# Patient Record
Sex: Male | Born: 1985 | Hispanic: Yes | Marital: Married | Smoking: Never smoker
Health system: Southern US, Community
[De-identification: ages and names within clinical notes are randomized; demographics above are authoritative.]

## PROBLEM LIST (undated history)

## (undated) DIAGNOSIS — I2699 Other pulmonary embolism without acute cor pulmonale: Secondary | ICD-10-CM

---

## 2020-01-15 ENCOUNTER — Emergency Department (HOSPITAL_COMMUNITY)
Admission: EM | Admit: 2020-01-15 | Discharge: 2020-01-15 | Disposition: A | Discharge status: Home / Self Care | Payer: Self-pay | Attending: Emergency Medicine | Admitting: Emergency Medicine

## 2020-01-15 ENCOUNTER — Other Ambulatory Visit: Payer: Self-pay

## 2020-01-15 ENCOUNTER — Emergency Department (HOSPITAL_COMMUNITY): Payer: Self-pay

## 2020-01-15 ENCOUNTER — Encounter (HOSPITAL_COMMUNITY): Payer: Self-pay | Admitting: Emergency Medicine

## 2020-01-15 DIAGNOSIS — M25512 Pain in left shoulder: Secondary | ICD-10-CM | POA: Insufficient documentation

## 2020-01-15 DIAGNOSIS — Z86711 Personal history of pulmonary embolism: Secondary | ICD-10-CM | POA: Insufficient documentation

## 2020-01-15 DIAGNOSIS — R0602 Shortness of breath: Secondary | ICD-10-CM | POA: Insufficient documentation

## 2020-01-15 DIAGNOSIS — R072 Precordial pain: Secondary | ICD-10-CM | POA: Insufficient documentation

## 2020-01-15 HISTORY — DX: Other pulmonary embolism without acute cor pulmonale: I26.99

## 2020-01-15 LAB — CBC
HCT: 44.1 % (ref 39.0–52.0)
Hemoglobin: 15 g/dL (ref 13.0–17.0)
MCH: 28.4 pg (ref 26.0–34.0)
MCHC: 34 g/dL (ref 30.0–36.0)
MCV: 83.5 fL (ref 80.0–100.0)
Platelets: 305 10*3/uL (ref 150–400)
RBC: 5.28 MIL/uL (ref 4.22–5.81)
RDW: 12.8 % (ref 11.5–15.5)
WBC: 6 10*3/uL (ref 4.0–10.5)
nRBC: 0 % (ref 0.0–0.2)

## 2020-01-15 LAB — BASIC METABOLIC PANEL
Anion gap: 10 (ref 5–15)
BUN: 15 mg/dL (ref 6–20)
CO2: 23 mmol/L (ref 22–32)
Calcium: 9.2 mg/dL (ref 8.9–10.3)
Chloride: 105 mmol/L (ref 98–111)
Creatinine, Ser: 0.75 mg/dL (ref 0.61–1.24)
GFR calc Af Amer: >60 mL/min (ref >60)
GFR calc non Af Amer: >60 mL/min (ref >60)
Glucose, Bld: 120 mg/dL — ABNORMAL HIGH (ref 70–99)
Potassium: 3.6 mmol/L (ref 3.5–5.1)
Sodium: 138 mmol/L (ref 135–145)

## 2020-01-15 LAB — TROPONIN I (HIGH SENSITIVITY)
Troponin I (High Sensitivity): <2 ng/L (ref <18)
Troponin I (High Sensitivity): <2 ng/L (ref <18)

## 2020-01-15 MED ORDER — IOHEXOL 350 MG/ML SOLN
100.0000 mL | Freq: Once | INTRAVENOUS | Status: AC | PRN
  Administered 2020-01-15: 100 mL via INTRAVENOUS

## 2020-01-15 MED ORDER — SODIUM CHLORIDE 0.9 % IV SOLN: INTRAVENOUS | Status: DC

## 2020-01-15 MED ORDER — SODIUM CHLORIDE 0.9% FLUSH: 3.0000 mL | Freq: Once | INTRAVENOUS | Status: DC

## 2020-01-15 NOTE — ED Provider Notes (Signed)
[facility] EMERGENCY DEPARTMENT Provider Note   CSN: [id number] Arrival date & time: 01/15/20  1145     History Chief Complaint  Patient presents with  . Chest Pain    Treveon Bourcier is a 34 y.o. male.  Patient with a complaint of left anterior left shoulder pain for the past 3 days.  Has been nonstop.  Not worse with moving of the arm.  Patient has a history of pulmonary embolus back in 2009.  Patient denies any leg swelling.  Denies any fevers any upper respiratory infection or congestion.  Denies any history of injury.        Past Medical History:  Diagnosis Date  . Pulmonary embolus (HCC)     There are no problems to display for this patient.   History reviewed. No pertinent surgical history.     No family history on file.  Social History   Tobacco Use  . Smoking status: Never Smoker  . Smokeless tobacco: Never Used  Substance Use Topics  . Alcohol use: Yes    Comment: weekend  . Drug use: Not Currently    Home Medications Prior to Admission medications   Medication Sig Start Date End Date Taking? Authorizing Provider  rivaroxaban (XARELTO) 20 MG TABS tablet Take 1 tablet by mouth daily.    [provider]    Allergies    Patient has no known allergies.  Review of Systems   Review of Systems  Constitutional: Negative for chills and fever.  HENT: Negative for congestion, rhinorrhea and sore throat.   Eyes: Negative for visual disturbance.  Respiratory: Positive for shortness of breath. Negative for cough.   Cardiovascular: Positive for chest pain. Negative for leg swelling.  Gastrointestinal: Negative for abdominal pain, diarrhea, nausea and vomiting.  Genitourinary: Negative for dysuria.  Musculoskeletal: Negative for back pain and neck pain.  Skin: Negative for rash.  Neurological: Negative for dizziness, light-headedness and headaches.  Hematological: Does not bruise/bleed easily.  Psychiatric/Behavioral: Negative for confusion.     Physical Exam Updated Vital Signs BP 117/75   Pulse 71   Temp 98 F (36.7 C) (Oral)   Resp 18   Wt 93.1 kg   SpO2 100%   Physical Exam Vitals and nursing note reviewed.  Constitutional:      Appearance: Normal appearance. He is well-developed.  HENT:     Head: Normocephalic and atraumatic.  Eyes:     Extraocular Movements: Extraocular movements intact.     Conjunctiva/sclera: Conjunctivae normal.     Pupils: Pupils are equal, round, and reactive to light.  Cardiovascular:     Rate and Rhythm: Normal rate and regular rhythm.     Heart sounds: No murmur.  Pulmonary:     Effort: Pulmonary effort is normal. No respiratory distress.     Breath sounds: Normal breath sounds.  Chest:     Chest wall: No tenderness.  Abdominal:     Palpations: Abdomen is soft.     Tenderness: There is no abdominal tenderness.  Musculoskeletal:     Cervical back: Normal range of motion and neck supple.  Skin:    General: Skin is warm and dry.  Neurological:     General: No focal deficit present.     Mental Status: He is alert and oriented to person, place, and time.     ED Results / Procedures / Treatments   Labs (all labs ordered are listed, but only abnormal results are displayed) Labs Reviewed  BASIC METABOLIC PANEL -  Abnormal; Notable for the following components:      Result Value   Glucose, Bld 120 (*)    All other components within normal limits  CBC  TROPONIN I (HIGH SENSITIVITY)  TROPONIN I (HIGH SENSITIVITY)    EKG EKG Interpretation  Date/Time:  Saturday January 15 2020 12:02:15 EST Ventricular Rate:  78 PR Interval:    QRS Duration: 100 QT Interval:  375 QTC Calculation: 428 R Axis:   82 Text Interpretation: Sinus rhythm Confirmed by Vanetta Mulders [phone] on 01/15/2020 12:16:01 PM   Radiology DG Chest 2 View  Result Date: 01/15/2020 CLINICAL DATA:  Left shoulder pain and shortness of breath for a few days. EXAM: CHEST - 2 VIEW COMPARISON:  None. FINDINGS:  Lungs clear. Heart size normal. No pneumothorax or pleural fluid. No bony abnormality. IMPRESSION: Negative chest. Electronically Signed   By: Drusilla Kanner M.D.   On: 01/15/2020 12:45   CT Angio Chest PE W/Cm &/Or Wo Cm  Result Date: 01/15/2020 CLINICAL DATA:  Left shoulder pain, shortness of breath for several days EXAM: CT ANGIOGRAPHY CHEST WITH CONTRAST TECHNIQUE: Multidetector CT imaging of the chest was performed using the standard protocol during bolus administration of intravenous contrast. Multiplanar CT image reconstructions and MIPs were obtained to evaluate the vascular anatomy. CONTRAST:  OMNIPAQUE IOHEXOL 350 MG/ML SOLN COMPARISON:  Same-day chest radiographs FINDINGS: Cardiovascular: Satisfactory opacification of the pulmonary arteries to the segmental level. No evidence of pulmonary embolism. Normal heart size. No pericardial effusion. Mediastinum/Nodes: No enlarged mediastinal, hilar, or axillary lymph nodes. Thyroid gland, trachea, and esophagus demonstrate no significant findings. Lungs/Pleura: Lungs are clear. No pleural effusion or pneumothorax. Upper Abdomen: No acute abnormality. Musculoskeletal: No chest wall abnormality. No acute or significant osseous findings. Review of the MIP images confirms the above findings. IMPRESSION: Negative examination for pulmonary embolism. Electronically Signed   By: Lauralyn Primes M.D.   On: 01/15/2020 14:20    Procedures Procedures (including critical care time)  Medications Ordered in ED Medications  sodium chloride flush (NS) 0.9 % injection 3 mL (3 mLs Intravenous Not Given 01/15/20 1220)  iohexol (OMNIPAQUE) 350 MG/ML injection 100 mL (100 mLs Intravenous Contrast Given 01/15/20 1335)    ED Course  I have reviewed the triage vital signs and the nursing notes.  Pertinent labs & imaging results that were available during my care of the patient were reviewed by me and considered in my medical decision making (see chart for details).     MDM Rules/Calculators/A&P                      Work-up for the chest pain and shortness of breath without evidence of any pulmonary embolus or pneumonia or pneumothorax also no evidence of any acute cardiac event.  May be musculoskeletal or rib soreness.  But not able to reproduce.  Will treat with anti-inflammatory.  Patient stable for discharge home. Final Clinical Impression(s) / ED Diagnoses Final diagnoses:  Precordial pain  SOB (shortness of breath)    Rx / DC Orders ED Discharge Orders    None       Vanetta Mulders, MD 01/15/20 1526

## 2020-01-15 NOTE — Discharge Instructions (Signed)
Work-up for the chest pain and shortness of breath for the past 3 days without evidence of any blood clots in the lungs or evidence of pneumonia or any evidence of is coming from the heart.  I would recommend taking medicines like Motrin ibuprofen for the next few days.  Return for any new or worse symptoms.

## 2020-01-15 NOTE — ED Notes (Signed)
E signature pad would not work in room,

## 2020-01-15 NOTE — ED Triage Notes (Signed)
Pt states that he is having pain in his left shoulder with sob this has been going on for about a few days

## 2020-03-01 ENCOUNTER — Other Ambulatory Visit: Payer: Self-pay

## 2020-03-01 ENCOUNTER — Encounter (HOSPITAL_COMMUNITY): Payer: Self-pay | Admitting: Emergency Medicine

## 2020-03-01 ENCOUNTER — Emergency Department (HOSPITAL_COMMUNITY)
Admission: EM | Admit: 2020-03-01 | Discharge: 2020-03-02 | Disposition: A | Discharge status: Home / Self Care | Payer: Self-pay | Attending: Emergency Medicine | Admitting: Emergency Medicine

## 2020-03-01 ENCOUNTER — Emergency Department (HOSPITAL_COMMUNITY): Payer: Self-pay

## 2020-03-01 DIAGNOSIS — Z7901 Long term (current) use of anticoagulants: Secondary | ICD-10-CM | POA: Insufficient documentation

## 2020-03-01 DIAGNOSIS — R0789 Other chest pain: Secondary | ICD-10-CM | POA: Insufficient documentation

## 2020-03-01 LAB — CBC
HCT: 42.6 % (ref 39.0–52.0)
Hemoglobin: 14.3 g/dL (ref 13.0–17.0)
MCH: 27.8 pg (ref 26.0–34.0)
MCHC: 33.6 g/dL (ref 30.0–36.0)
MCV: 82.9 fL (ref 80.0–100.0)
Platelets: 267 10*3/uL (ref 150–400)
RBC: 5.14 MIL/uL (ref 4.22–5.81)
RDW: 12.2 % (ref 11.5–15.5)
WBC: 7.5 10*3/uL (ref 4.0–10.5)
nRBC: 0 % (ref 0.0–0.2)

## 2020-03-01 LAB — TROPONIN I (HIGH SENSITIVITY)
Troponin I (High Sensitivity): 2 ng/L (ref <18)
Troponin I (High Sensitivity): 2 ng/L (ref <18)

## 2020-03-01 LAB — BASIC METABOLIC PANEL
Anion gap: 8 (ref 5–15)
BUN: 25 mg/dL — ABNORMAL HIGH (ref 6–20)
CO2: 24 mmol/L (ref 22–32)
Calcium: 9.1 mg/dL (ref 8.9–10.3)
Chloride: 102 mmol/L (ref 98–111)
Creatinine, Ser: 0.88 mg/dL (ref 0.61–1.24)
GFR calc Af Amer: >60 mL/min (ref >60)
GFR calc non Af Amer: >60 mL/min (ref >60)
Glucose, Bld: 97 mg/dL (ref 70–99)
Potassium: 3.6 mmol/L (ref 3.5–5.1)
Sodium: 134 mmol/L — ABNORMAL LOW (ref 135–145)

## 2020-03-01 NOTE — ED Triage Notes (Signed)
Pt c/o chest pain and shortness of breath for the past two weeks. He also feels like he has pollen in his throat.

## 2020-03-02 ENCOUNTER — Emergency Department (HOSPITAL_COMMUNITY): Payer: Self-pay

## 2020-03-02 MED ORDER — IOHEXOL 350 MG/ML SOLN
100.0000 mL | Freq: Once | INTRAVENOUS | Status: AC | PRN
  Administered 2020-03-02: 100 mL via INTRAVENOUS

## 2020-03-02 MED ORDER — DICLOFENAC SODIUM 75 MG PO TBEC: 75.0000 mg | DELAYED_RELEASE_TABLET | Freq: Two times a day (BID) | ORAL | 0 refills | Status: AC

## 2020-03-02 NOTE — ED Provider Notes (Signed)
[facility] EMERGENCY DEPARTMENT Provider Note   CSN: [id number] Arrival date & time: 03/01/20  1953     History Chief Complaint  Patient presents with  . Chest Pain    Zachary Wilkerson is a 34 y.o. male.  The history is provided by the patient. No language interpreter was used.  Chest Pain Pain location:  L chest Pain quality: aching   Pain radiates to:  Does not radiate Pain severity:  Moderate Onset quality:  Gradual Timing:  Constant Progression:  Worsening Chronicity:  New Relieved by:  Nothing Worsened by:  Nothing Ineffective treatments:  None tried Risk factors: no coronary artery disease, no diabetes mellitus, no high cholesterol and no hypertension        Past Medical History:  Diagnosis Date  . Pulmonary embolus (HCC)     There are no problems to display for this patient.   History reviewed. No pertinent surgical history.     No family history on file.  Social History   Tobacco Use  . Smoking status: Never Smoker  . Smokeless tobacco: Never Used  Substance Use Topics  . Alcohol use: Yes    Comment: weekend  . Drug use: Not Currently    Home Medications Prior to Admission medications   Medication Sig Start Date End Date Taking? Authorizing Provider  rivaroxaban (XARELTO) 20 MG TABS tablet Take 1 tablet by mouth daily.    [provider]    Allergies    Patient has no known allergies.  Review of Systems   Review of Systems  Cardiovascular: Positive for chest pain.  All other systems reviewed and are negative.   Physical Exam Updated Vital Signs BP 126/84   Pulse (!) 58   Temp 98.4 F (36.9 C) (Oral)   Resp 15   Ht 5\' 9"  (1.753 m)   Wt 95.3 kg   SpO2 100%   BMI 31.01 kg/m   Physical Exam Vitals and nursing note reviewed.  Constitutional:      Appearance: He is well-developed.  HENT:     Head: Normocephalic and atraumatic.  Eyes:     Conjunctiva/sclera: Conjunctivae normal.  Cardiovascular:     Rate and  Rhythm: Normal rate and regular rhythm.     Heart sounds: Normal heart sounds. No murmur.  Pulmonary:     Effort: Pulmonary effort is normal. No respiratory distress.     Breath sounds: Normal breath sounds.  Chest:     Chest wall: No mass.  Abdominal:     Palpations: Abdomen is soft.     Tenderness: There is no abdominal tenderness.  Musculoskeletal:        General: Normal range of motion.     Cervical back: Normal range of motion and neck supple.  Skin:    General: Skin is warm and dry.  Neurological:     General: No focal deficit present.     Mental Status: He is alert.  Psychiatric:        Mood and Affect: Mood normal.   left chest tender to palpation,  Pain with deep breath  ED Results / Procedures / Treatments   Labs (all labs ordered are listed, but only abnormal results are displayed) Labs Reviewed  BASIC METABOLIC PANEL - Abnormal; Notable for the following components:      Result Value   Sodium 134 (*)    BUN 25 (*)    All other components within normal limits  CBC  TROPONIN I (HIGH SENSITIVITY)  TROPONIN  I (HIGH SENSITIVITY)    EKG EKG Interpretation  Date/Time:  Wednesday March 01 2020 20:32:42 EDT Ventricular Rate:  65 PR Interval:  140 QRS Duration: 94 QT Interval:  366 QTC Calculation: 380 R Axis:   81 Text Interpretation: Normal sinus rhythm Normal ECG When compared with ECG of 01/15/2020, No significant change was found Confirmed by Delora Fuel ([id number]) on 03/01/2020 10:47:14 PM   Radiology DG Chest 2 View  Result Date: 03/01/2020 CLINICAL DATA:  Chest pain, shortness of breath EXAM: CHEST - 2 VIEW COMPARISON:  03/01/2020 FINDINGS: Repeat study with nipple markers performed. No suspicious nodular densities over the lungs. Heart is normal size. No effusions or acute bony abnormality. IMPRESSION: Normal study. Electronically Signed   By: Rolm Baptise M.D.   On: 03/01/2020 23:36   DG Chest 2 View  Result Date: 03/01/2020 CLINICAL DATA:  Chest pain.  EXAM: CHEST - 2 VIEW COMPARISON:  January 15, 2020 FINDINGS: No pneumothorax. The heart, hila, mediastinum are normal. A rounded density projects over the lateral right chest, not seen on the comparison study. No focal infiltrate or overt edema. IMPRESSION: 1. Probable button on the patient or a nipple shadow over the lateral right chest. Recommend removing everything from the patient's chest, applying nipple markers, and repeating the study. No acute abnormalities are seen. Electronically Signed   By: Dorise Bullion III M.D   On: 03/01/2020 21:06   CT Angio Chest PE W and/or Wo Contrast  Result Date: 03/02/2020 CLINICAL DATA:  Chest pain, history of PE recently stopped anticoagulation EXAM: CT ANGIOGRAPHY CHEST WITH CONTRAST TECHNIQUE: Multidetector CT imaging of the chest was performed using the standard protocol during bolus administration of intravenous contrast. Multiplanar CT image reconstructions and MIPs were obtained to evaluate the vascular anatomy. CONTRAST:  177mL OMNIPAQUE IOHEXOL 350 MG/ML SOLN COMPARISON:  January 15, 2020 the FINDINGS: Cardiovascular: There is a optimal opacification of the pulmonary arteries. There is no central,segmental, or subsegmental filling defects within the pulmonary arteries. The heart is normal in size. No pericardial effusion or thickening. No evidence right heart strain. There is normal three-vessel brachiocephalic anatomy without proximal stenosis. The thoracic aorta is normal in appearance. Mediastinum/Nodes: No hilar, mediastinal, or axillary adenopathy. Thyroid gland, trachea, and esophagus demonstrate no significant findings. Lungs/Pleura: There is a stable 3 mm pulmonary nodule in the anterior right upper lobe, series 6, image 60. No pleural effusion or pneumothorax. No airspace consolidation. Upper Abdomen: No acute abnormalities present in the visualized portions of the upper abdomen. Musculoskeletal: No chest wall abnormality. No acute or significant  osseous findings. Review of the MIP images confirms the above findings. IMPRESSION: No central, segmental, or subsegmental pulmonary embolism. No other acute intrathoracic pathology to explain the patient's symptoms. Electronically Signed   By: Prudencio Pair M.D.   On: 03/02/2020 00:36    Procedures Procedures (including critical care time)  Medications Ordered in ED Medications  iohexol (OMNIPAQUE) 350 MG/ML injection 100 mL (100 mLs Intravenous Contrast Given 03/02/20 0017)    ED Course  I have reviewed the triage vital signs and the nursing notes.  Pertinent labs & imaging results that were available during my care of the patient were reviewed by me and considered in my medical decision making (see chart for details).  Clinical Course as of Mar 02 40  Wed Mar 01, 2020  2223 DG Chest 2 View [LS]  2224 DG Chest 2 View [LS]    Clinical Course User Index [LS] Fransico Meadow, [place]  MDM Rules/Calculators/A&P                      MDM:  Pt has a history of PE.  Pt reported pain is similar.  Ct angio no acute abnormality  Final Clinical Impression(s) / ED Diagnoses Final diagnoses:  Chest wall pain    Rx / DC Orders ED Discharge Orders         Ordered    diclofenac (VOLTAREN) 75 MG EC tablet  2 times daily     03/02/20 0045           Elson Areas, Cordelia Poche 03/02/20 0046    Bethann Berkshire, MD 03/06/20 [phone]

## 2020-09-07 IMAGING — CT CT ANGIO CHEST
2 of 6 series · 17 of 46 positions shown · IV contrast (Omnipaque or Isovue)
Comparison: January 15, 2020 the

CLINICAL DATA: Chest pain, history of PE recently stopped
anticoagulation

EXAM:
CT ANGIOGRAPHY CHEST WITH CONTRAST
TECHNIQUE: Multidetector CT imaging of the chest was performed using the
standard protocol during bolus administration of intravenous
contrast. Multiplanar CT image reconstructions and MIPs were
obtained to evaluate the vascular anatomy.
CONTRAST:  100mL OMNIPAQUE IOHEXOL 350 MG/ML SOLN

[Series 5: pe axial thins · axial · 0.65mm/px · z∈[+1368,+1573]mm · 14 of 225 slices shown]
[im 10/225  lung]
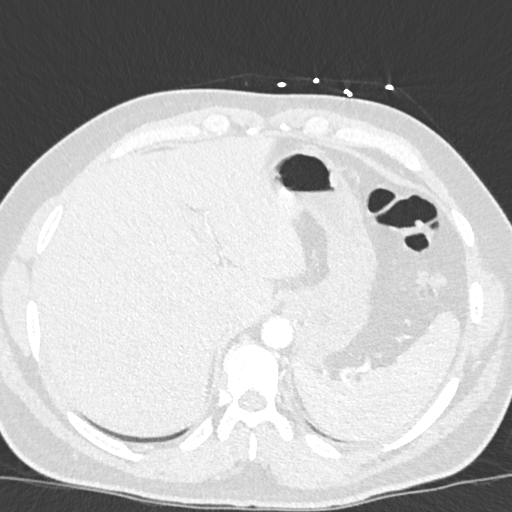
[im 30/225  soft-tissue]
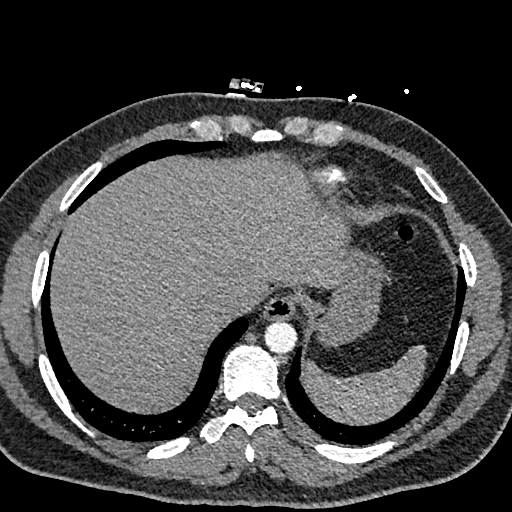
[im 39/225  lung]
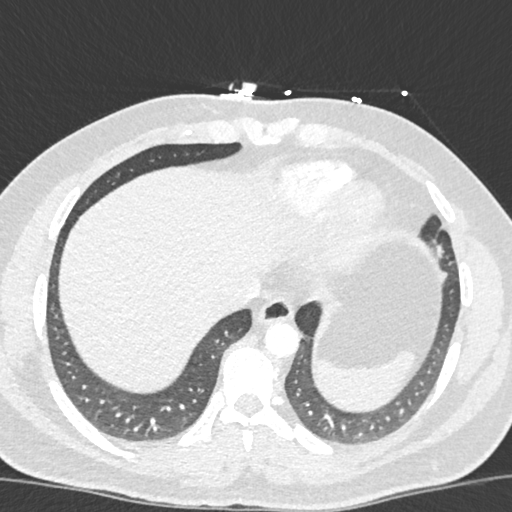
[im 59/225  soft-tissue]
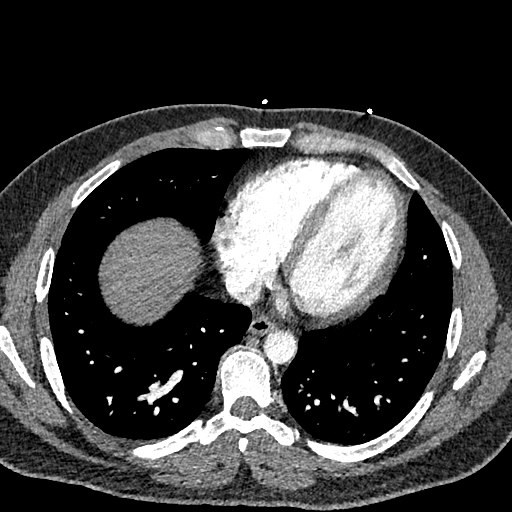
[im 78/225  lung]
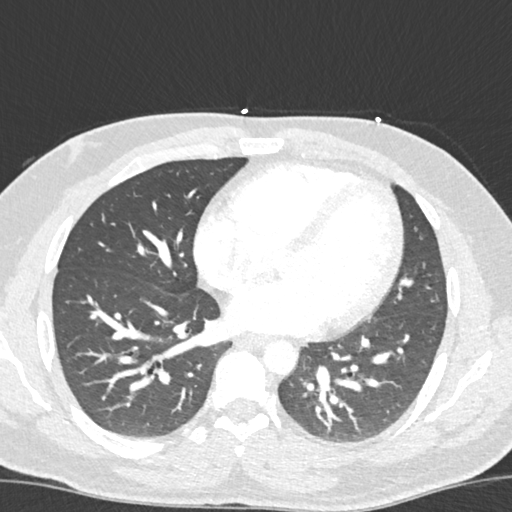
[im 88/225  soft-tissue]
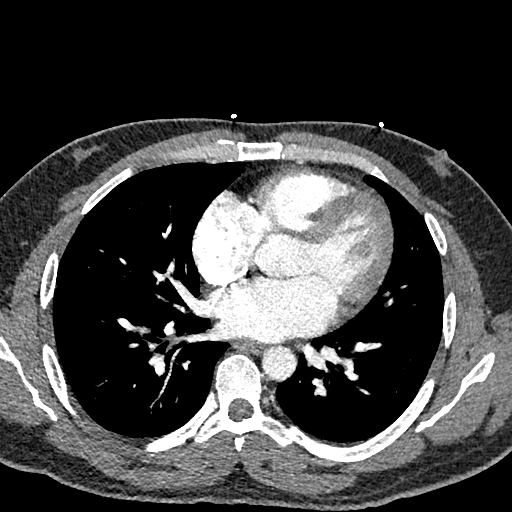
[im 108/225  lung]
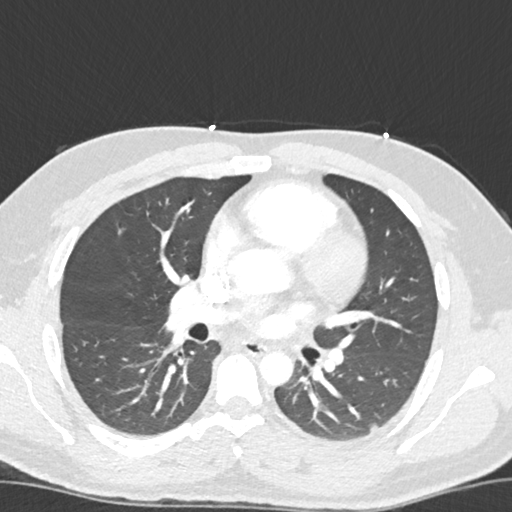
[im 117/225  soft-tissue]
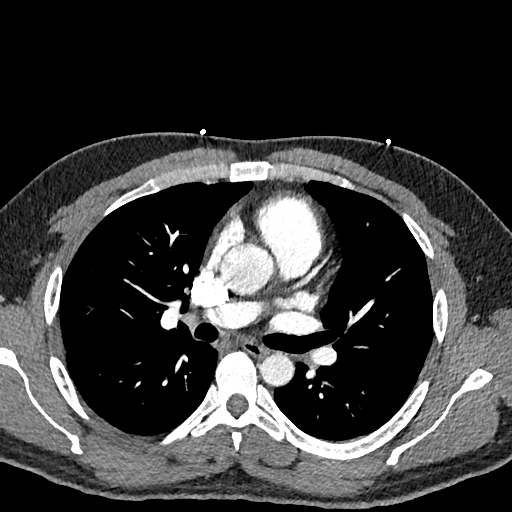
[im 137/225  lung]
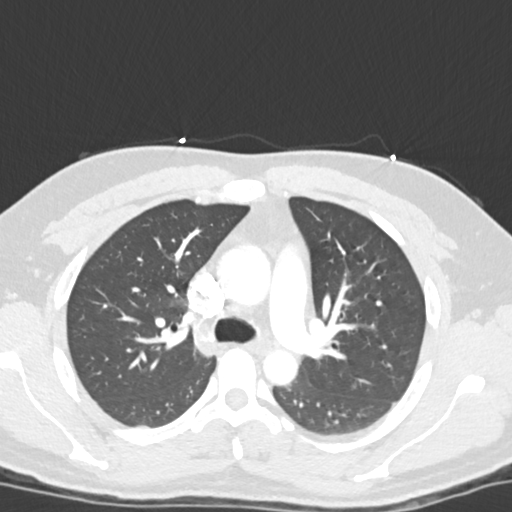
[im 147/225  soft-tissue]
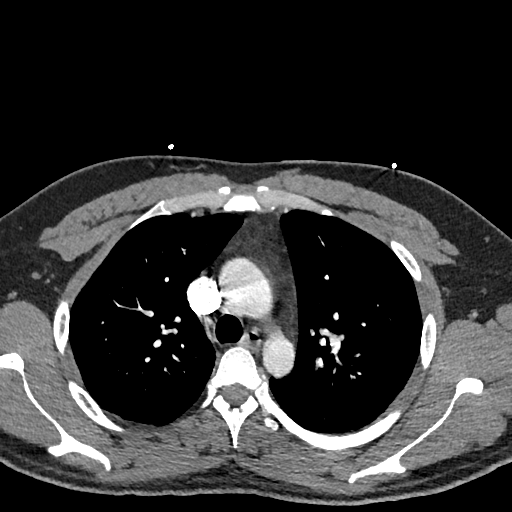
[im 166/225  lung]
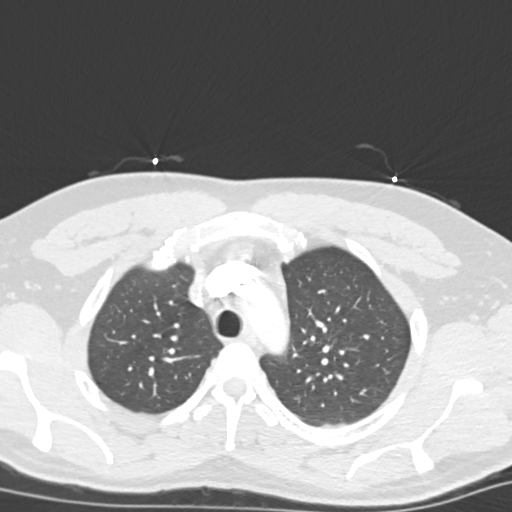
[im 186/225  soft-tissue]
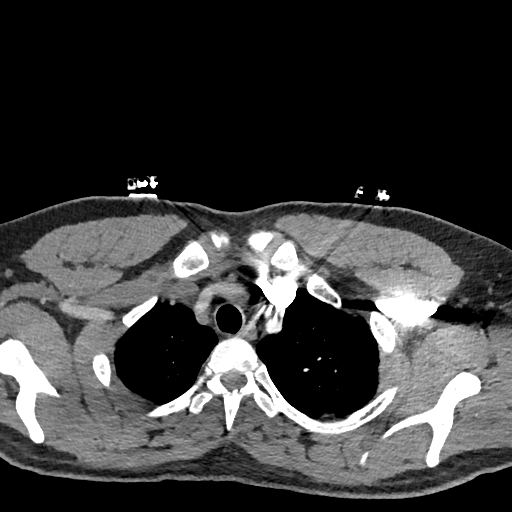
[im 195/225  lung]
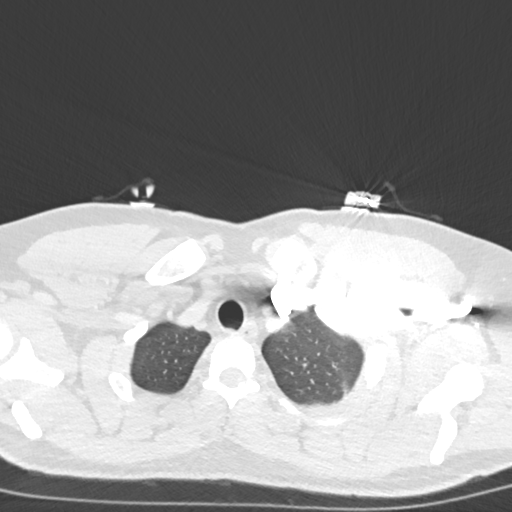
[im 215/225  soft-tissue]
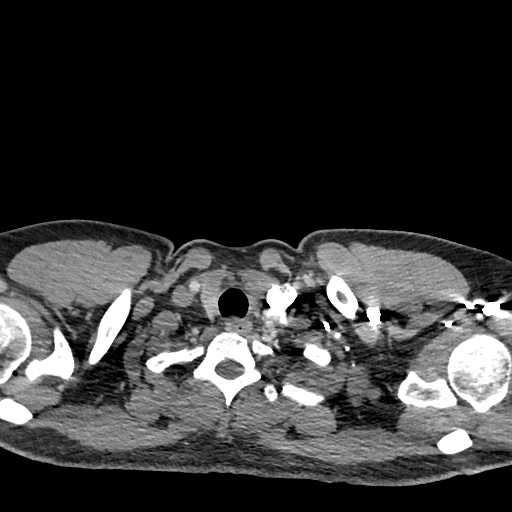

[Series 8: cor soft · coronal · 0.45mm/px · 3 of 125 slices shown]
[im 32/125  soft-tissue]
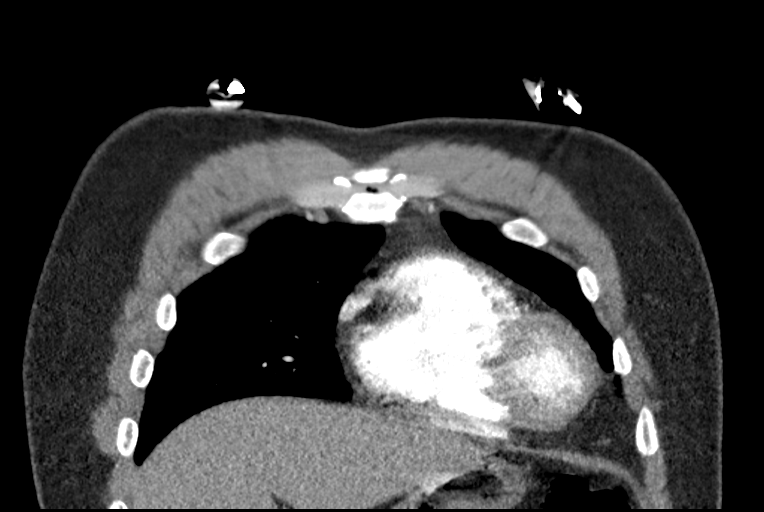
[im 63/125  soft-tissue]
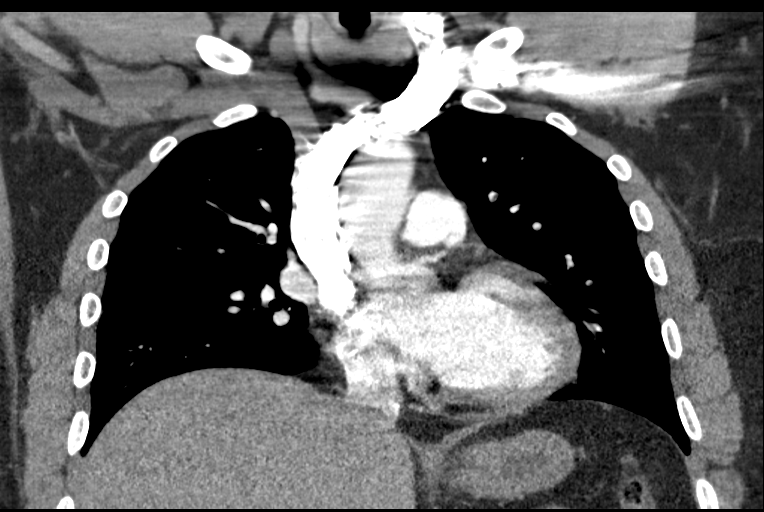
[im 94/125  soft-tissue]
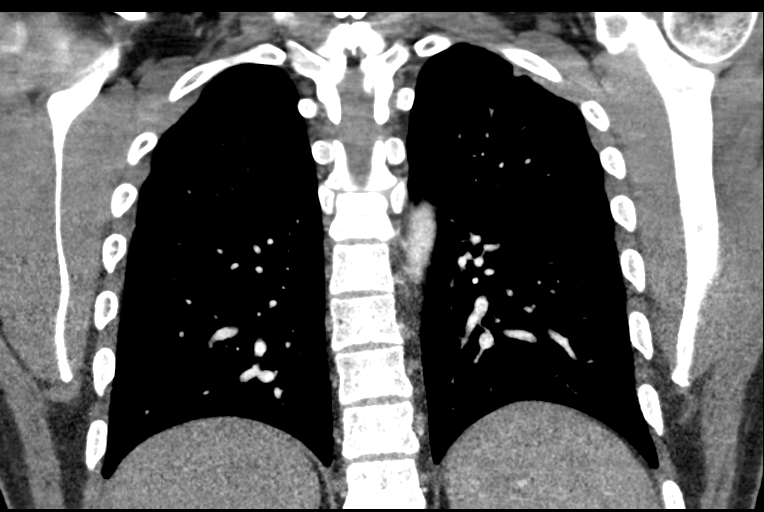

[17 of 46 positions shown; findings below may reference images not displayed]

FINDINGS: Cardiovascular: There is a optimal opacification of the pulmonary
arteries. There is no central,segmental, or subsegmental filling
defects within the pulmonary arteries. The heart is normal in size.
No pericardial effusion or thickening. No evidence right heart
strain. There is normal three-vessel brachiocephalic anatomy without
proximal stenosis. The thoracic aorta is normal in appearance.

Mediastinum/Nodes: No hilar, mediastinal, or axillary adenopathy.
Thyroid gland, trachea, and esophagus demonstrate no significant
findings.

Lungs/Pleura: There is a stable 3 mm pulmonary nodule in the
anterior right upper lobe, series 6, image 60. No pleural effusion
or pneumothorax. No airspace consolidation.

Upper Abdomen: No acute abnormalities present in the visualized
portions of the upper abdomen.

Musculoskeletal: No chest wall abnormality. No acute or significant
osseous findings.

Review of the MIP images confirms the above findings.
IMPRESSION: No central, segmental, or subsegmental pulmonary embolism.

No other acute intrathoracic pathology to explain the patient's
symptoms.
